# Patient Record
Sex: Female | Born: 1995 | Race: White | Hispanic: No | Marital: Single | Smoking: Current some day smoker
Health system: Southern US, Community
[De-identification: ages and names within clinical notes are randomized; demographics above are authoritative.]

## PROBLEM LIST (undated history)

## (undated) DIAGNOSIS — R4589 Other symptoms and signs involving emotional state: Secondary | ICD-10-CM

## (undated) DIAGNOSIS — Z62820 Parent-biological child conflict: Secondary | ICD-10-CM

## (undated) DIAGNOSIS — F32A Depression, unspecified: Secondary | ICD-10-CM

## (undated) DIAGNOSIS — F938 Other childhood emotional disorders: Secondary | ICD-10-CM

## (undated) DIAGNOSIS — F329 Major depressive disorder, single episode, unspecified: Secondary | ICD-10-CM

---
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
## 2020-04-29 ENCOUNTER — Emergency Department
Admission: EM | Admit: 2020-04-29 | Discharge: 2020-04-30 | Disposition: A | Discharge status: Home / Self Care | Payer: Medicaid Other | Attending: Emergency Medicine | Admitting: Emergency Medicine

## 2020-04-29 ENCOUNTER — Other Ambulatory Visit: Payer: Self-pay

## 2020-04-29 ENCOUNTER — Emergency Department: Payer: Medicaid Other

## 2020-04-29 DIAGNOSIS — Z79899 Other long term (current) drug therapy: Secondary | ICD-10-CM | POA: Insufficient documentation

## 2020-04-29 DIAGNOSIS — O4690 Antepartum hemorrhage, unspecified, unspecified trimester: Secondary | ICD-10-CM

## 2020-04-29 DIAGNOSIS — Z3A12 12 weeks gestation of pregnancy: Secondary | ICD-10-CM | POA: Insufficient documentation

## 2020-04-29 DIAGNOSIS — R5383 Other fatigue: Secondary | ICD-10-CM | POA: Insufficient documentation

## 2020-04-29 DIAGNOSIS — F17200 Nicotine dependence, unspecified, uncomplicated: Secondary | ICD-10-CM | POA: Insufficient documentation

## 2020-04-29 DIAGNOSIS — R1032 Left lower quadrant pain: Secondary | ICD-10-CM | POA: Insufficient documentation

## 2020-04-29 DIAGNOSIS — O200 Threatened abortion: Secondary | ICD-10-CM | POA: Diagnosis not present

## 2020-04-29 DIAGNOSIS — O26891 Other specified pregnancy related conditions, first trimester: Secondary | ICD-10-CM | POA: Diagnosis present

## 2020-04-29 DIAGNOSIS — R1030 Lower abdominal pain, unspecified: Secondary | ICD-10-CM | POA: Diagnosis not present

## 2020-04-29 DIAGNOSIS — O039 Complete or unspecified spontaneous abortion without complication: Secondary | ICD-10-CM

## 2020-04-29 DIAGNOSIS — Z91040 Latex allergy status: Secondary | ICD-10-CM | POA: Insufficient documentation

## 2020-04-29 DIAGNOSIS — R110 Nausea: Secondary | ICD-10-CM | POA: Diagnosis not present

## 2020-04-29 DIAGNOSIS — R1031 Right lower quadrant pain: Secondary | ICD-10-CM | POA: Diagnosis not present

## 2020-04-29 LAB — CBC WITH DIFFERENTIAL/PLATELET
Abs Immature Granulocytes: 0.02 10*3/uL (ref 0.00–0.07)
Basophils Absolute: 0 10*3/uL (ref 0.0–0.1)
Basophils Relative: 0 %
Eosinophils Absolute: 0.4 10*3/uL (ref 0.0–0.5)
Eosinophils Relative: 4 %
HCT: 37.7 % (ref 36.0–46.0)
Hemoglobin: 13.3 g/dL (ref 12.0–15.0)
Immature Granulocytes: 0 %
Lymphocytes Relative: 35 %
Lymphs Abs: 4 10*3/uL (ref 0.7–4.0)
MCH: 31.1 pg (ref 26.0–34.0)
MCHC: 35.3 g/dL (ref 30.0–36.0)
MCV: 88.1 fL (ref 80.0–100.0)
Monocytes Absolute: 0.9 10*3/uL (ref 0.1–1.0)
Monocytes Relative: 8 %
Neutro Abs: 6 10*3/uL (ref 1.7–7.7)
Neutrophils Relative %: 53 %
Platelets: 189 10*3/uL (ref 150–400)
RBC: 4.28 MIL/uL (ref 3.87–5.11)
RDW: 12 % (ref 11.5–15.5)
WBC: 11.4 10*3/uL — ABNORMAL HIGH (ref 4.0–10.5)
nRBC: 0 % (ref 0.0–0.2)

## 2020-04-29 MED ORDER — SODIUM CHLORIDE 0.9 % IV BOLUS
1000.0000 mL | Freq: Once | INTRAVENOUS | Status: AC
  Administered 2020-04-29: 1000 mL via INTRAVENOUS

## 2020-04-29 MED ORDER — FENTANYL CITRATE (PF) 100 MCG/2ML IJ SOLN
50.0000 ug | Freq: Once | INTRAMUSCULAR | Status: AC
  Administered 2020-04-29: 50 ug via INTRAVENOUS
  Filled 2020-04-29: qty 2

## 2020-04-29 MED ORDER — ONDANSETRON HCL 4 MG/2ML IJ SOLN
4.0000 mg | Freq: Once | INTRAMUSCULAR | Status: AC
  Administered 2020-04-29: 4 mg via INTRAVENOUS
  Filled 2020-04-29: qty 2

## 2020-04-29 NOTE — ED Provider Notes (Signed)
[facility] Emergency Department Provider Note  ____________________________________________   First MD Initiated Contact with Patient 04/29/20 2325     (approximate)  I have reviewed the triage vital signs and the nursing notes.   HISTORY  Chief Complaint Abdominal Pain    HPI Samantha Montoya is a 24 y.o. female  G1P0 at estimated 12-[redacted] wk GA here with abdominal pain. Pt reportedly has had multiple recent positive outpt pregnancy tests. She began having cramping in her lower abdomen 3-4 days ago. [facility] which showed no IUP, no gestational sac but HCG was >8000. She subsequently was told to come to the ED for evaluation. She's had ongoing lower abd pain, cramping since then with nausea. No vomiting. She also has begun having vaginal bleeding and passed large clots/tissue prior to arrival. No known h/o prior PID or ectopics. No fever, chills. Pain is cramping, intermittent. No alleviating factors.        Past Medical History:  Diagnosis Date  . 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 Depression   . 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 Parent-child relational problem 10/10/2015    Patient Active Problem List   Diagnosis Date Noted  . 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 Parent-child relational problem 10/10/2015  . 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 MDD (major depressive disorder) 10/09/2015    No past surgical history on file.  Prior to Admission medications   Medication Sig Start Date End Date Taking? Authorizing Provider  cyproheptadine (PERIACTIN) 4 MG tablet Take 0.5 tablets (2 mg total) by mouth 2 (two) times daily as needed for allergies. 1/2 tab by mouth 1 hour before meals, twice a day to increase appetite as needed. 10/16/15   Thedora Hinders, MD  escitalopram (LEXAPRO) 10 MG tablet Take 1 tablet (10 mg total) by mouth daily. 10/16/15   Thedora Hinders, MD    HYDROcodone-acetaminophen (NORCO/VICODIN) 5-325 MG tablet Take 1-2 tablets by mouth every 6 (six) hours as needed for moderate pain or severe pain. 04/30/20 04/30/21  Shaune Pollack, MD  ibuprofen (ADVIL) 600 MG tablet Take 1 tablet (600 mg total) by mouth every 8 (eight) hours as needed for moderate pain or cramping. 04/30/20   Shaune Pollack, MD  norgestimate-ethinyl estradiol (ORTHO-CYCLEN,SPRINTEC,PREVIFEM) 0.25-35 MG-MCG tablet Take 1 tablet by mouth daily.    [provider]  ondansetron (ZOFRAN ODT) 4 MG disintegrating tablet Take 1 tablet (4 mg total) by mouth every 8 (eight) hours as needed for nausea or vomiting. 04/30/20   Shaune Pollack, MD    Allergies Latex  No family history on file.  Social History Social History   Tobacco Use  . Smoking status: Current Some Day Smoker  Substance Use Topics  . Alcohol use: No  . Drug use: Yes    Types: Marijuana    Review of Systems  Review of Systems  Constitutional: Positive for fatigue. Negative for fever.  HENT: Negative for congestion and sore throat.   Eyes: Negative for visual disturbance.  Respiratory: Negative for cough and shortness of breath.   Cardiovascular: Negative for chest pain.  Gastrointestinal: Positive for abdominal pain and nausea. Negative for diarrhea and vomiting.  Genitourinary: Positive for pelvic pain. Negative for flank pain.  Musculoskeletal: Negative for back pain and neck pain.  Skin: Negative for rash and wound.  Neurological: Negative for weakness.  All other systems reviewed and are negative.    ____________________________________________  PHYSICAL EXAM:  VITAL SIGNS: ED Triage Vitals  Enc Vitals Group     BP 04/29/20 2301 121/77     Pulse Rate 04/29/20 2301 87     Resp 04/29/20 2301 18     Temp 04/29/20 2301 98.7 F (37.1 C)     Temp Source 04/29/20 2301 Oral     SpO2 04/29/20 2301 100 %     Weight 04/29/20 2302 118 lb (53.5 kg)     Height 04/29/20 2302 5' 7.5"  (1.715 m)     Head Circumference --      Peak Flow --      Pain Score --      Pain Loc --      Pain Edu? --      Excl. in GC? --      Physical Exam Vitals and nursing note reviewed.  Constitutional:      General: She is not in acute distress.    Appearance: She is well-developed.  HENT:     Head: Normocephalic and atraumatic.  Eyes:     Conjunctiva/sclera: Conjunctivae normal.  Cardiovascular:     Rate and Rhythm: Normal rate and regular rhythm.     Heart sounds: Normal heart sounds. No murmur heard.  No friction rub.  Pulmonary:     Effort: Pulmonary effort is normal. No respiratory distress.     Breath sounds: Normal breath sounds. No wheezing or rales.  Abdominal:     General: There is no distension.     Palpations: Abdomen is soft.     Tenderness: There is abdominal tenderness in the right lower quadrant, suprapubic area and left lower quadrant. There is guarding. There is no rebound.  Musculoskeletal:     Cervical back: Neck supple.  Skin:    General: Skin is warm.     Capillary Refill: Capillary refill takes less than 2 seconds.  Neurological:     Mental Status: She is alert and oriented to person, place, and time.     Motor: No abnormal muscle tone.       ____________________________________________   LABS (all labs ordered are listed, but only abnormal results are displayed)  Labs Reviewed  CBC WITH DIFFERENTIAL/PLATELET - Abnormal; Notable for the following components:      Result Value   WBC 11.4 (*)    All other components within normal limits  HCG, QUANTITATIVE, PREGNANCY - Abnormal; Notable for the following components:   hCG, Beta Chain, Quant, S 8,619 (*)    All other components within normal limits  COMPREHENSIVE METABOLIC PANEL - Abnormal; Notable for the following components:   Potassium 3.4 (*)    Total Bilirubin 1.5 (*)    All other components within normal limits  SARS CORONAVIRUS 2 BY RT PCR (HOSPITAL ORDER, PERFORMED IN [place]  [facility])  TYPE AND SCREEN    ____________________________________________  EKG: Normal sinus rhythm, VR 82. QRS 99, QTc 466. No acute St elevations or depressions. No EKG evidence of ischemia or infarct. ________________________________________  RADIOLOGY All imaging, including plain films, CT scans, and ultrasounds, independently reviewed by me, and interpretations confirmed via formal radiology reads.  ED MD interpretation:   [place]: Inevitable AB, no signs of ectopic  Official radiology report(s): US OB LESS THAN 14 WEEKS W/ OB TRANSVAGINAL AND DOPPLER  Result Date: 04/30/2020 CLINICAL DATA:  Vaginal bleeding, quantitative beta hCG 8619 EXAM: OBSTETRIC <14 WK [place] AND TRANSVAGINAL OB US DOPPLER ULTRASOUND OF OVARIES TECHNIQUE: Both transabdominal and transvaginal ultrasound examinations were performed for  complete evaluation of the gestation as well as the maternal uterus, adnexal regions, and pelvic cul-de-sac. Transvaginal technique was performed to assess early pregnancy. Color and duplex Doppler ultrasound was utilized to evaluate blood flow to the ovaries. COMPARISON:  None. FINDINGS: Intrauterine gestational sac: Elongated fluid collection extending from the lower uterine segment to the cervical canal without discernible focal intrauterine gestational sac. Yolk sac:  Not Visualized. Embryo:  Not Visualized. Cardiac Activity: Not Visualized. Maternal uterus/adnexae: Decidual appearance of the myometrium within the anteverted uterus. Elongated fluid attenuation collection in the lower uterine segment measuring approximately 3.7 x 1.2 x 1.3 cm, without discernible yolk sac or fetal pole. Concerning for possible abortion in progress/blighted ovum. Normal appearance of the bilateral ovaries. No concerning adnexal lesions. No free fluid in the pelvis. Pulsed Doppler evaluation of both ovaries demonstrates normal appearing low-resistance arterial and venous waveforms. IMPRESSION: Elongated fluid  collection within the lower uterine segment extending into the internal cervical os cervix concerning for an inevitable miscarriage. Recommend close clinical followup and serial quantitative beta HCGs and ultrasound. Electronically Signed   By: Kreg Shropshire M.D.   On: 04/30/2020 00:40    ____________________________________________  PROCEDURES   Procedure(s) performed (including Critical Care):  Procedures  ____________________________________________  INITIAL IMPRESSION / MDM / ASSESSMENT AND PLAN / ED COURSE  As part of my medical decision making, I reviewed the following data within the electronic MEDICAL RECORD NUMBER Nursing notes reviewed and incorporated, Old chart reviewed, Notes from prior ED visits, and [place] Controlled Substance Database       *Ahilyn L Inclan was evaluated in Emergency Department on 04/30/2020 for the symptoms described in the history of present illness. She was evaluated in the context of the global COVID-19 pandemic, which necessitated consideration that the patient might be at risk for infection with the SARS-CoV-2 virus that causes COVID-19. Institutional protocols and algorithms that pertain to the evaluation of patients at risk for COVID-19 are in a state of rapid change based on information released by regulatory bodies including the CDC and federal and state organizations. These policies and algorithms were followed during the patient's care in the ED.  Some ED evaluations and interventions may be delayed as a result of limited staffing during the pandemic.*     Medical Decision Making:  Pleasant 24 yo G1P0 here with lower abd cramping and vaginal bleeding. Recently had U/S showing no IUP at [facility] Here, she has minimal bleeding but moderate pain, and U/S is consistent with inevitable AB. No evidence of significant free fluid, andexal abnormalities or ectopic. Her hgb is stable. She is Rh positive. Will treat supportively with nsaids, analgesics,  and good return precautions with 48 hours OB f/u. No signs of septic AB or other complications.  ____________________________________________  FINAL CLINICAL IMPRESSION(S) / ED DIAGNOSES  Final diagnoses:  Spontaneous miscarriage     MEDICATIONS GIVEN DURING THIS VISIT:  Medications  sodium chloride 0.9 % bolus 1,000 mL (has no administration in time range)  morphine 4 MG/ML injection 4 mg (has no administration in time range)  ondansetron (ZOFRAN) injection 4 mg (has no administration in time range)  ketorolac (TORADOL) 30 MG/ML injection 15 mg (has no administration in time range)  fentaNYL (SUBLIMAZE) injection 50 mcg (50 mcg Intravenous Given 04/29/20 2359)  ondansetron (ZOFRAN) injection 4 mg (4 mg Intravenous Given 04/29/20 2359)     ED Discharge Orders         Ordered    HYDROcodone-acetaminophen (NORCO/VICODIN) 5-325 MG tablet  Every 6 hours PRN        04/30/20 0128    ondansetron (ZOFRAN ODT) 4 MG disintegrating tablet  Every 8 hours PRN        04/30/20 0128    ibuprofen (ADVIL) 600 MG tablet  Every 8 hours PRN        04/30/20 0128           Note:  This document was prepared using Dragon voice recognition software and may include unintentional dictation errors.   Shaune PollackIsaacs, Teren Franckowiak, MD 04/30/20 [phone]

## 2020-04-29 NOTE — ED Notes (Signed)
Patient very pale and passed out while finishing answer triage questions.  Charge nurse notified.

## 2020-04-29 NOTE — ED Triage Notes (Signed)
Patient reports +pregnancy test.  [facility] no baby seen in [facility].  Reports having pain an bleeding.

## 2020-04-29 NOTE — ED Notes (Addendum)
Pt to the room in a recliner, syncopal episode in triage, perspiration noted, pt reports abdominal pain, vaginal bleeding since Wed night.  Dr. Erma Heritage at bedside with [place].

## 2020-04-30 LAB — COMPREHENSIVE METABOLIC PANEL
ALT: 10 U/L (ref 0–44)
AST: 16 U/L (ref 15–41)
Albumin: 4.3 g/dL (ref 3.5–5.0)
Alkaline Phosphatase: 48 U/L (ref 38–126)
Anion gap: 8 (ref 5–15)
BUN: 13 mg/dL (ref 6–20)
CO2: 26 mmol/L (ref 22–32)
Calcium: 9.4 mg/dL (ref 8.9–10.3)
Chloride: 104 mmol/L (ref 98–111)
Creatinine, Ser: 0.62 mg/dL (ref 0.44–1.00)
GFR calc Af Amer: >60 mL/min (ref >60)
GFR calc non Af Amer: >60 mL/min (ref >60)
Glucose, Bld: 89 mg/dL (ref 70–99)
Potassium: 3.4 mmol/L — ABNORMAL LOW (ref 3.5–5.1)
Sodium: 138 mmol/L (ref 135–145)
Total Bilirubin: 1.5 mg/dL — ABNORMAL HIGH (ref 0.3–1.2)
Total Protein: 7.3 g/dL (ref 6.5–8.1)

## 2020-04-30 LAB — TYPE AND SCREEN
ABO/RH(D): O POS
Antibody Screen: NEGATIVE

## 2020-04-30 LAB — HCG, QUANTITATIVE, PREGNANCY: hCG, Beta Chain, Quant, S: 8619 m[IU]/mL — ABNORMAL HIGH (ref <5)

## 2020-04-30 MED ORDER — HYDROCODONE-ACETAMINOPHEN 5-325 MG PO TABS
1.0000 | ORAL_TABLET | Freq: Four times a day (QID) | ORAL | 0 refills | Status: AC | PRN
Start: 2020-04-30 — End: 2021-04-30

## 2020-04-30 MED ORDER — IBUPROFEN 600 MG PO TABS: 600.0000 mg | ORAL_TABLET | Freq: Three times a day (TID) | ORAL | 0 refills | Status: AC | PRN

## 2020-04-30 MED ORDER — ONDANSETRON HCL 4 MG/2ML IJ SOLN
4.0000 mg | Freq: Once | INTRAMUSCULAR | Status: AC
  Administered 2020-04-30: 4 mg via INTRAVENOUS
  Filled 2020-04-30: qty 2

## 2020-04-30 MED ORDER — ONDANSETRON 4 MG PO TBDP: 4.0000 mg | ORAL_TABLET | Freq: Three times a day (TID) | ORAL | 0 refills | Status: AC | PRN

## 2020-04-30 MED ORDER — MORPHINE SULFATE (PF) 4 MG/ML IV SOLN
4.0000 mg | Freq: Once | INTRAVENOUS | Status: AC
  Administered 2020-04-30: 4 mg via INTRAVENOUS
  Filled 2020-04-30: qty 1

## 2020-04-30 MED ORDER — KETOROLAC TROMETHAMINE 30 MG/ML IJ SOLN
15.0000 mg | Freq: Once | INTRAMUSCULAR | Status: AC
  Administered 2020-04-30: 15 mg via INTRAVENOUS
  Filled 2020-04-30: qty 1

## 2020-04-30 NOTE — Discharge Instructions (Signed)
Follow-up with an OBGYN (as above) in 48-72 hours  Drink plenty of fluids  I would expect pain and bleeding for the next 12-24 hours. If bleeding through more than 1 pad every hour for 6 or more hours, return to the ER.  Take the medications as prescribed.

## 2020-04-30 NOTE — ED Notes (Signed)
US at bedside

## 2022-03-06 IMAGING — US US OB < 14 WKS - US OB TV - US DOPPLER
1 series · 13 of 28 positions shown · non-contrast
Comparison: None.

CLINICAL DATA: Vaginal bleeding, quantitative beta hCG 4345



[Series 1: us ob less than 14 weeks with ob transvaginal · 13 of 213 slices shown]
[im 8/213]
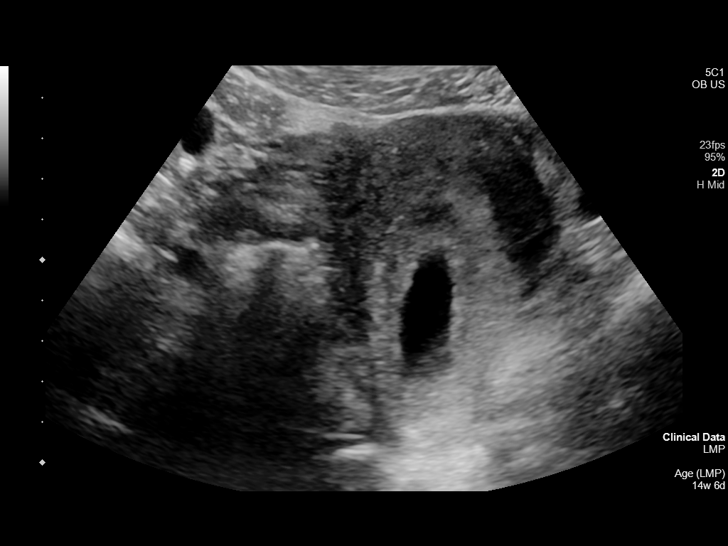
[im 24/213]
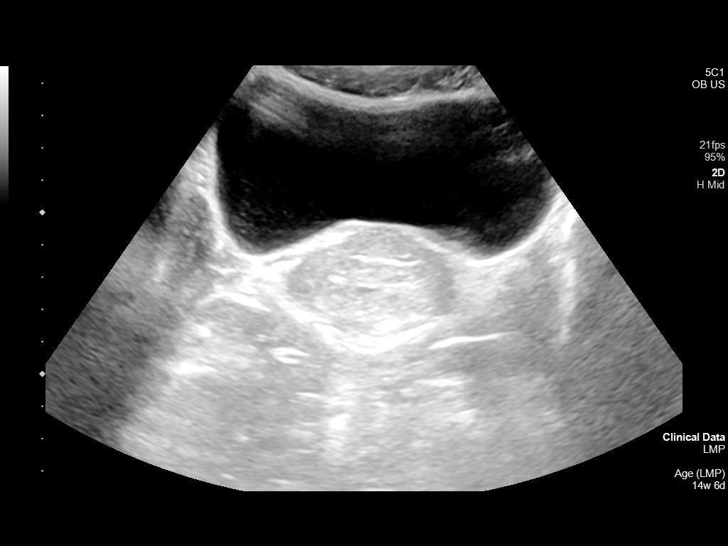
[im 40/213]
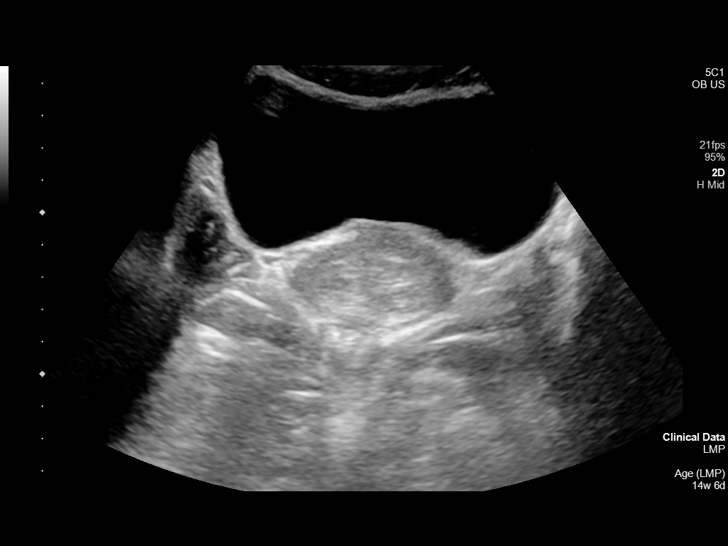
[im 55/213]
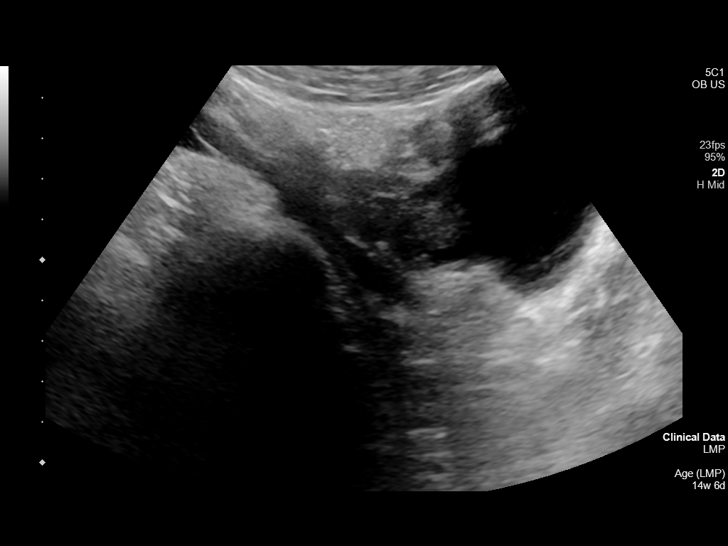
[im 71/213]
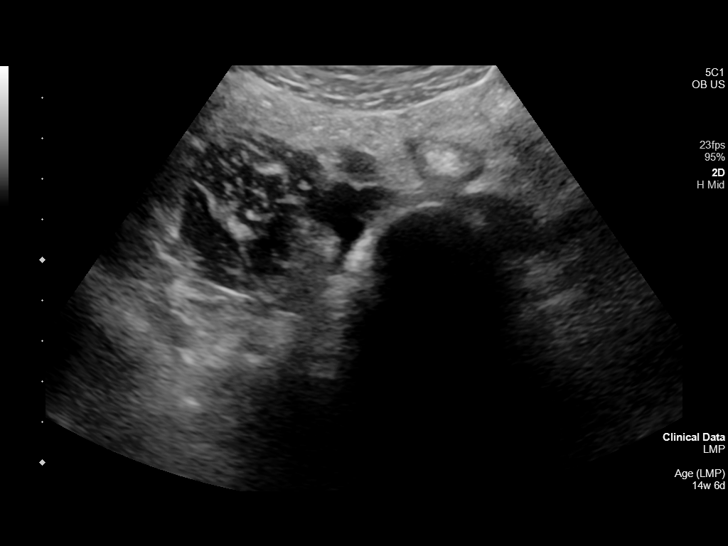
[im 87/213]
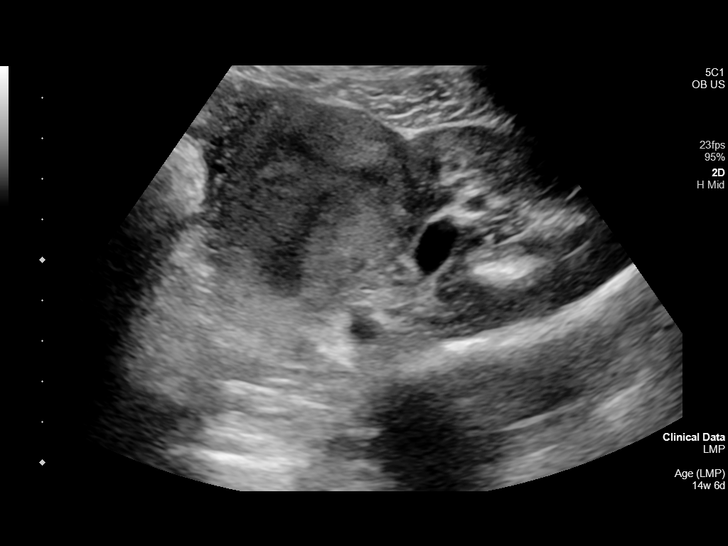
[im 110/213]
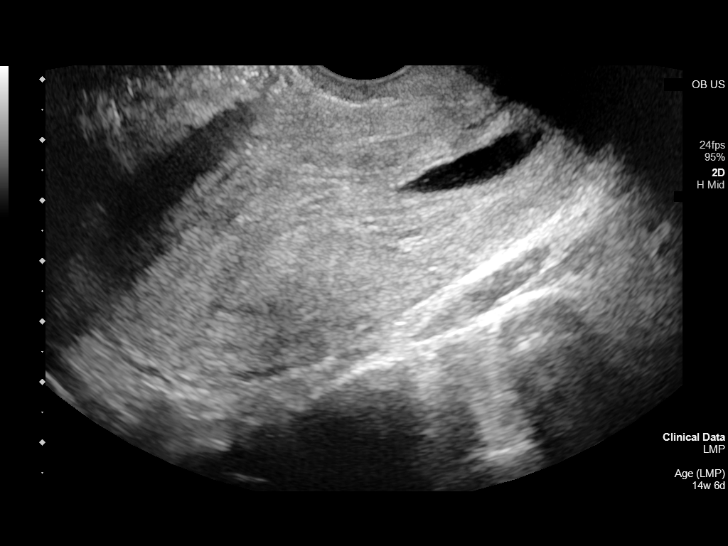
[im 126/213]
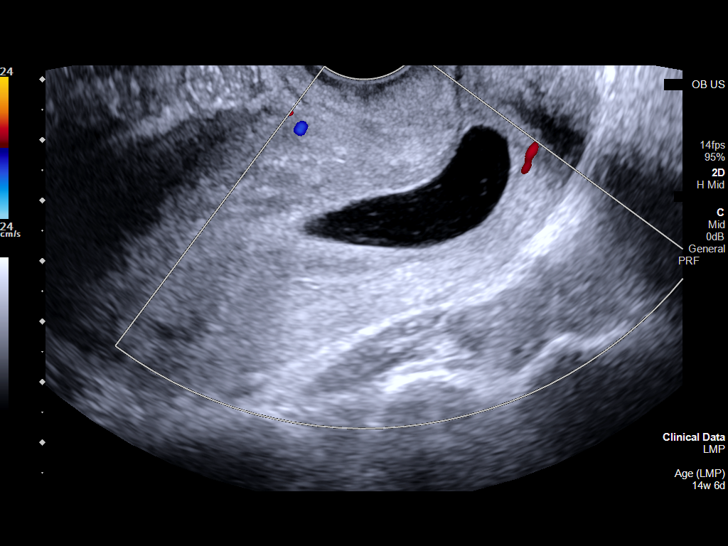
[im 142/213]
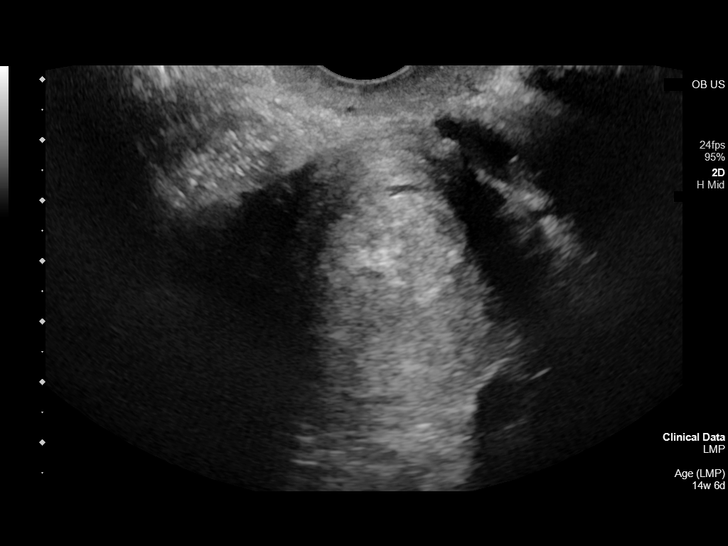
[im 158/213]
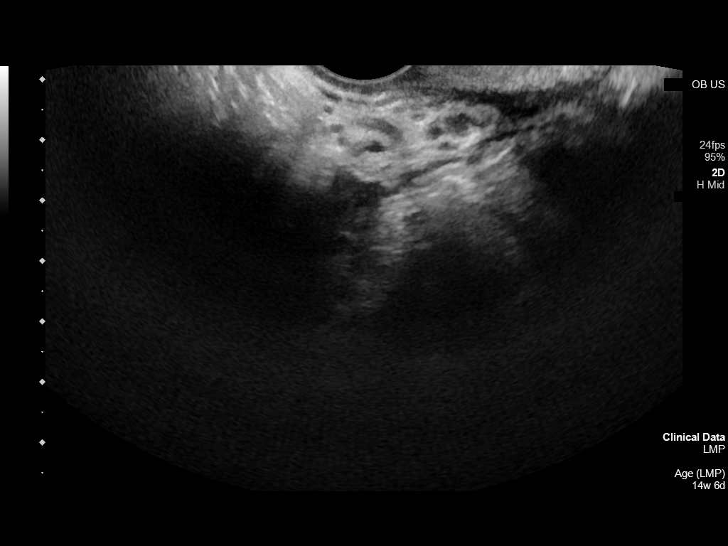
[im 173/213]
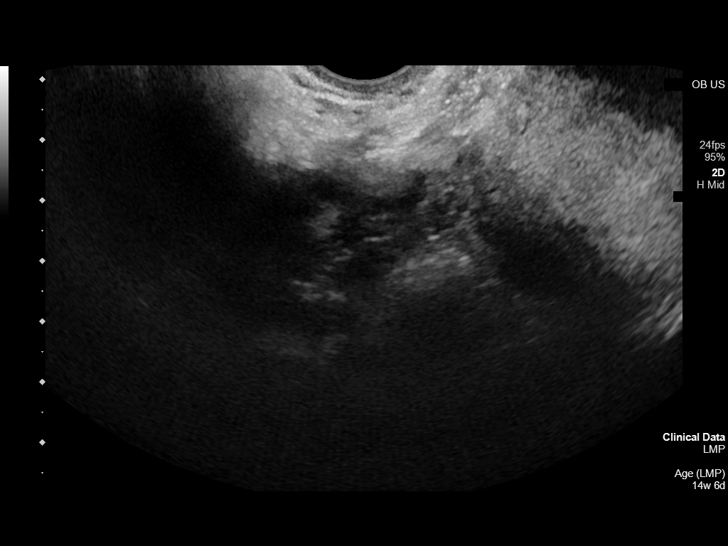
[im 189/213]
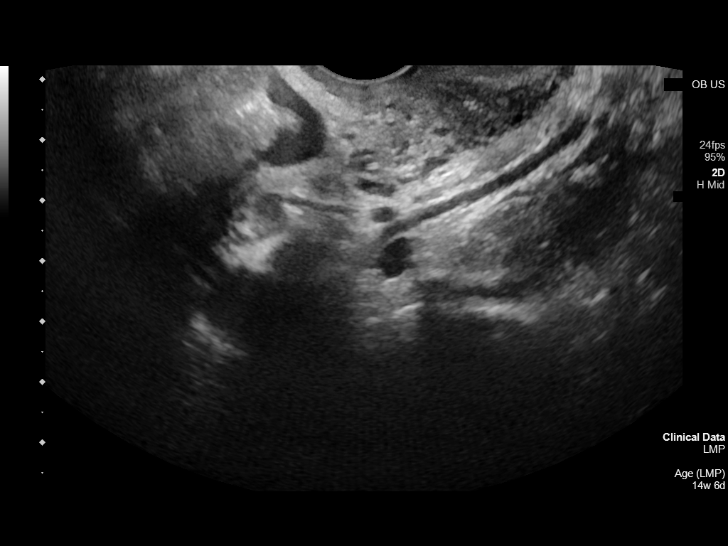
[im 205/213]
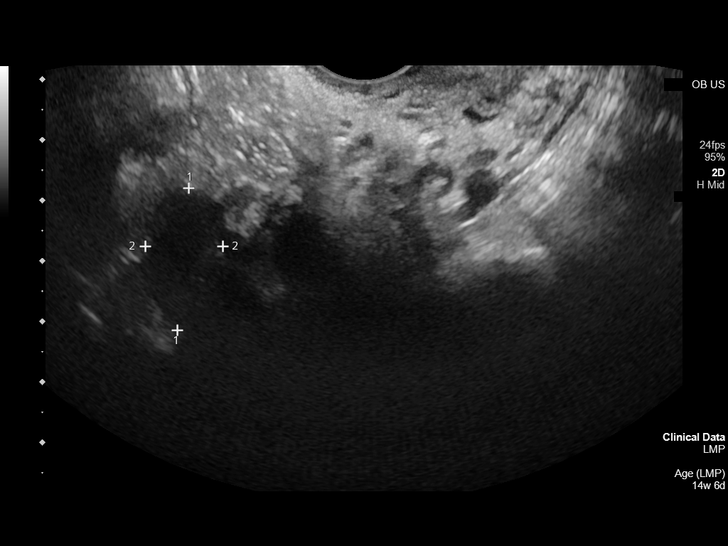

[13 of 28 positions shown; findings below may reference images not displayed]

FINDINGS: Intrauterine gestational sac: Elongated fluid collection extending
from the lower uterine segment to the cervical canal without
discernible focal intrauterine gestational sac.

Yolk sac:  Not Visualized.

Embryo:  Not Visualized.

Cardiac Activity: Not Visualized.

Maternal uterus/adnexae: Decidual appearance of the myometrium
within the anteverted uterus. Elongated fluid attenuation collection
in the lower uterine segment measuring approximately 3.7 x 1.2 x
cm, without discernible yolk sac or fetal pole. Concerning for
possible abortion in progress/blighted ovum.

Normal appearance of the bilateral ovaries. No concerning adnexal
lesions. No free fluid in the pelvis.

Pulsed Doppler evaluation of both ovaries demonstrates normal
appearing low-resistance arterial and venous waveforms.
IMPRESSION: Elongated fluid collection within the lower uterine segment
extending into the internal cervical os cervix concerning for an
inevitable miscarriage. Recommend close clinical followup and serial
quantitative beta HCGs and ultrasound.
